# Patient Record
Sex: Male | Born: 1975 | Race: Black or African American | Hispanic: No | Marital: Single | Smoking: Never smoker
Health system: Southern US, Community
[De-identification: ages and names within clinical notes are randomized; demographics above are authoritative.]

---

## 2002-02-09 ENCOUNTER — Encounter: Payer: Self-pay | Admitting: Family Medicine

## 2002-02-09 ENCOUNTER — Ambulatory Visit (HOSPITAL_COMMUNITY): Admission: RE | Admit: 2002-02-09 | Discharge: 2002-02-09 | Payer: Self-pay | Admitting: Family Medicine

## 2014-10-31 ENCOUNTER — Emergency Department (HOSPITAL_COMMUNITY)
Admission: EM | Admit: 2014-10-31 | Discharge: 2014-10-31 | Disposition: A | Discharge status: Home / Self Care | Payer: PRIVATE HEALTH INSURANCE | Attending: Emergency Medicine | Admitting: Emergency Medicine

## 2014-10-31 ENCOUNTER — Encounter (HOSPITAL_COMMUNITY): Payer: Self-pay | Admitting: Emergency Medicine

## 2014-10-31 ENCOUNTER — Emergency Department (HOSPITAL_COMMUNITY): Payer: PRIVATE HEALTH INSURANCE

## 2014-10-31 DIAGNOSIS — X58XXXA Exposure to other specified factors, initial encounter: Secondary | ICD-10-CM | POA: Insufficient documentation

## 2014-10-31 DIAGNOSIS — S60551A Superficial foreign body of right hand, initial encounter: Secondary | ICD-10-CM | POA: Diagnosis present

## 2014-10-31 DIAGNOSIS — Y9389 Activity, other specified: Secondary | ICD-10-CM | POA: Insufficient documentation

## 2014-10-31 DIAGNOSIS — Y9289 Other specified places as the place of occurrence of the external cause: Secondary | ICD-10-CM | POA: Insufficient documentation

## 2014-10-31 DIAGNOSIS — Y998 Other external cause status: Secondary | ICD-10-CM | POA: Diagnosis not present

## 2014-10-31 LAB — CBC WITH DIFFERENTIAL/PLATELET
Basophils Absolute: 0 10*3/uL (ref 0.0–0.1)
Basophils Relative: 0 % (ref 0–1)
Eosinophils Absolute: 0.1 10*3/uL (ref 0.0–0.7)
Eosinophils Relative: 1 % (ref 0–5)
HCT: 42.2 % (ref 39.0–52.0)
Hemoglobin: 14.2 g/dL (ref 13.0–17.0)
Lymphocytes Relative: 23 % (ref 12–46)
Lymphs Abs: 1.1 10*3/uL (ref 0.7–4.0)
MCH: 26.2 pg (ref 26.0–34.0)
MCHC: 33.6 g/dL (ref 30.0–36.0)
MCV: 78 fL (ref 78.0–100.0)
Monocytes Absolute: 0.4 10*3/uL (ref 0.1–1.0)
Monocytes Relative: 9 % (ref 3–12)
Neutro Abs: 3 10*3/uL (ref 1.7–7.7)
Neutrophils Relative %: 67 % (ref 43–77)
Platelets: 270 10*3/uL (ref 150–400)
RBC: 5.41 MIL/uL (ref 4.22–5.81)
RDW: 14.4 % (ref 11.5–15.5)
WBC: 4.6 10*3/uL (ref 4.0–10.5)

## 2014-10-31 LAB — BASIC METABOLIC PANEL
Anion gap: 8 (ref 5–15)
BUN: 13 mg/dL (ref 6–23)
CO2: 28 mmol/L (ref 19–32)
Calcium: 9.3 mg/dL (ref 8.4–10.5)
Chloride: 103 mmol/L (ref 96–112)
Creatinine, Ser: 1.18 mg/dL (ref 0.50–1.35)
GFR calc Af Amer: 87 mL/min — ABNORMAL LOW (ref >90)
GFR calc non Af Amer: 75 mL/min — ABNORMAL LOW (ref >90)
Glucose, Bld: 96 mg/dL (ref 70–99)
Potassium: 4.4 mmol/L (ref 3.5–5.1)
Sodium: 139 mmol/L (ref 135–145)

## 2014-10-31 MED ORDER — LORAZEPAM 2 MG/ML IJ SOLN: 1.0000 mg | Freq: Once | INTRAMUSCULAR | Status: DC

## 2014-10-31 MED ORDER — SODIUM CHLORIDE 0.9 % IV BOLUS (SEPSIS)
500.0000 mL | Freq: Once | INTRAVENOUS | Status: AC
  Administered 2014-10-31: 500 mL via INTRAVENOUS

## 2014-10-31 MED ORDER — DOXYCYCLINE HYCLATE 100 MG PO CAPS: 100.0000 mg | ORAL_CAPSULE | Freq: Two times a day (BID) | ORAL | Status: DC

## 2014-10-31 MED ORDER — BACITRACIN ZINC 500 UNIT/GM EX OINT
TOPICAL_OINTMENT | CUTANEOUS | Status: DC
Start: 2014-10-31 — End: 2014-11-01
  Filled 2014-10-31: qty 0.9

## 2014-10-31 MED ORDER — LIDOCAINE HCL (PF) 2 % IJ SOLN
10.0000 mL | Freq: Once | INTRAMUSCULAR | Status: AC
Start: 2014-10-31 — End: 2014-10-31
  Administered 2014-10-31: 10 mL via INTRADERMAL
  Filled 2014-10-31: qty 10

## 2014-10-31 MED ORDER — IBUPROFEN 400 MG PO TABS
ORAL_TABLET | ORAL | Status: AC
  Administered 2014-10-31: 400 mg
  Filled 2014-10-31: qty 1

## 2014-10-31 MED ORDER — DOXYCYCLINE HYCLATE 100 MG PO TABS
100.0000 mg | ORAL_TABLET | Freq: Once | ORAL | Status: AC
  Administered 2014-10-31: 100 mg via ORAL
  Filled 2014-10-31: qty 1

## 2014-10-31 NOTE — ED Notes (Addendum)
Patient c/o right hand pain and swelling x6 weeks that is progressively getting worse. Patient denies any known hx. Patient has low grade temp of 100.2 in triage. CNS intact. Patient unable to make fist. Per patient could earlier but can't at this time.

## 2014-10-31 NOTE — Discharge Instructions (Signed)
Elevate your right hand above your heart, tonight.  Soak the right hand in warm water every 2 hours, for 3 days.  Take ibuprofen, 400 mg 3 times a day for pain.  See your doctor next week if you are not improving or have additional problems.  Return here, if needed.

## 2014-10-31 NOTE — ED Provider Notes (Signed)
CSN: [id number]     Arrival date & time 10/31/14  1559 History   First MD Initiated Contact with Patient 10/31/14 1932     Chief Complaint  Patient presents with  . Hand Pain     (Consider location/radiation/quality/duration/timing/severity/associated sxs/prior Treatment) HPI   Simmie DaviesKenneth Klett is a 39 y.o. male who presents for evaluation of pain, swelling right hand. The pain is gradually worse. No known trauma. He works as a Lobbyist"picker". He denies weakness, dizziness, nausea, vomiting, cough, shortness of breath or chest pain. There are no other known modifying factors.   History reviewed. No pertinent past medical history. History reviewed. No pertinent past surgical history. Family History  Problem Relation Age of Onset  . Cancer Mother    History  Substance Use Topics  . Smoking status: Never Smoker   . Smokeless tobacco: Never Used  . Alcohol Use: No    Review of Systems  All other systems reviewed and are negative.     Allergies  Review of patient's allergies indicates no known allergies.  Home Medications   Prior to Admission medications   Medication Sig Start Date End Date Taking? Authorizing Provider  ibuprofen (ADVIL,MOTRIN) 200 MG tablet Take 200 mg by mouth every 6 (six) hours as needed for mild pain or moderate pain.   Yes Historical Provider, MD  doxycycline (VIBRAMYCIN) 100 MG capsule Take 1 capsule (100 mg total) by mouth 2 (two) times daily. 10/31/14   Mancel BaleElliott Ruven Corradi, MD   BP 121/86 mmHg  Pulse 68  Temp(Src) 99.4 F (37.4 C) (Oral)  Resp 16  Ht 5\' 8"  (1.727 m)  Wt 230 lb (104.327 kg)  BMI 34.98 kg/m2  SpO2 95% Physical Exam  Constitutional: He is oriented to person, place, and time. He appears well-developed and well-nourished.  HENT:  Head: Normocephalic and atraumatic.  Right Ear: External ear normal.  Left Ear: External ear normal.  Eyes: Conjunctivae and EOM are normal. Pupils are equal, round, and reactive to light.  Neck: Normal range of  motion and phonation normal. Neck supple.  Cardiovascular: Normal rate.   Pulmonary/Chest: Effort normal. He exhibits no bony tenderness.  Musculoskeletal: Normal range of motion.  Also, right third knuckle with localized swelling, and an area of firmness consistent with subcutaneous abnormality of a nonspecific nature. There is no localized erythema, fluctuance or drainage. He is neurovascularly and functionally intact distally.  Neurological: He is alert and oriented to person, place, and time. No cranial nerve deficit or sensory deficit. He exhibits normal muscle tone. Coordination normal.  Skin: Skin is warm, dry and intact.  Psychiatric: He has a normal mood and affect. His behavior is normal. Judgment and thought content normal.  Nursing note and vitals reviewed.   ED Course  Procedures (including critical care time)  Patient was initially evaluated in a hallway bed. I walked with him into the patient. Room, to show him an image of his x-ray of the right hand. While we were discussing the findings, and the treatment, he told me that he felt funny. He then collapsed. I was able to catch him and got him to the floor, without trauma. He was then placed on a stretcher by nursing staff.  Medications  bacitracin 500 UNIT/GM ointment (not administered)  ibuprofen (ADVIL,MOTRIN) 400 MG tablet (not administered)  sodium chloride 0.9 % bolus 500 mL (500 mLs Intravenous New Bag/Given 10/31/14 2001)  lidocaine (XYLOCAINE) 2 % injection 10 mL (10 mLs Intradermal Given by Other 10/31/14 2001)  Patient Vitals for the past 24 hrs:  BP Temp Temp src Pulse Resp SpO2 Height Weight  10/31/14 1948 121/86 mmHg 99.4 F (37.4 C) Oral 68 16 95 % - -  10/31/14 1655 119/72 mmHg 100.2 F (37.9 C) Oral 80 20 100 %  (1.727 m) 230 lb (104.327 kg)   After stabilization with IV fluids, and I&D was carried out  Procedure- I&D right hand, to recover foreign body- right dorsal third MCP region cleansed with  Shur-Clens, then irrigated with saline. 2% Xylocaine was instilled for numbing. The area was opened over the indurated area, with a #11 blade scalpel, 0.5 cm. Wound was deeply probed. No foreign body was recovered. There is mild bleeding, but no other change. Post procedure. Patient was informed that he can still have a foreign body that needs to be removed either by warm soaks or if he has persistent symptoms. He may require orthopedic evaluation at that time   9:03 PM Reevaluation with update and discussion. After initial assessment and treatment, an updated evaluation reveals he was treated with ibuprofen for pain postprocedure. Findings discussed with the patient, all questions answered. Alishah Schulte L      Labs Review Labs Reviewed  BASIC METABOLIC PANEL - Abnormal; Notable for the following:    GFR calc non Af Amer 75 (*)    GFR calc Af Amer 87 (*)    All other components within normal limits  CBC WITH DIFFERENTIAL/PLATELET    Imaging Review Dg Hand Complete Right  10/31/2014   CLINICAL DATA:  Right hand pain and swelling over the past 6 weeks, worsening. Reduced range of motion.  EXAM: RIGHT HAND - COMPLETE 3+ VIEW  COMPARISON:  None.  FINDINGS: 10 mm curvilinear wirelike foreign body in the subcutaneous tissues dorsal to the third metacarpal, with surrounding soft tissue swelling.  No significant bony abnormality observed.  IMPRESSION: 1. 10 mm long wire shaped metal foreign body in the soft tissues dorsal to the head of the third metacarpal, with surrounding soft tissue swelling.   Electronically Signed   By: Gaylyn Rong M.D.   On: 10/31/2014 17:12     EKG Interpretation None      MDM   Final diagnoses:  Foreign body of right hand, initial encounter    Form body, right hand, status post I&D, without recovery of foreign body. Patient understands that he still has a foreign body and that it may require removal or further procedures to treat him. He'll be covered with  antibiotics, since he has a retained foreign body.  Nursing Notes Reviewed/ Care Coordinated Applicable Imaging Reviewed Interpretation of Laboratory Data incorporated into ED treatment  The patient appears reasonably screened and/or stabilized for discharge and I doubt any other medical condition or other [facility] requiring further screening, evaluation, or treatment in the ED at this time prior to discharge.  Plan: Home Medications- doxycycline; Home Treatments- warms soaks to encourage drainage, and recovery of foreign body.; return here if the recommended treatment, does not improve the symptoms; Recommended follow up- PCP, one week, possibly orthopedic referral that time for persistent symptoms     Mancel Bale, MD 10/31/14 2106

## 2015-04-01 ENCOUNTER — Encounter (HOSPITAL_COMMUNITY): Payer: Self-pay | Admitting: Emergency Medicine

## 2015-04-01 ENCOUNTER — Emergency Department (HOSPITAL_COMMUNITY)
Admission: EM | Admit: 2015-04-01 | Discharge: 2015-04-01 | Disposition: A | Discharge status: Home / Self Care | Payer: PRIVATE HEALTH INSURANCE | Attending: Emergency Medicine | Admitting: Emergency Medicine

## 2015-04-01 DIAGNOSIS — Z791 Long term (current) use of non-steroidal anti-inflammatories (NSAID): Secondary | ICD-10-CM | POA: Insufficient documentation

## 2015-04-01 DIAGNOSIS — M5441 Lumbago with sciatica, right side: Secondary | ICD-10-CM | POA: Insufficient documentation

## 2015-04-01 MED ORDER — CYCLOBENZAPRINE HCL 5 MG PO TABS: ORAL_TABLET | ORAL | Status: DC

## 2015-04-01 MED ORDER — DEXAMETHASONE SODIUM PHOSPHATE 4 MG/ML IJ SOLN
10.0000 mg | Freq: Once | INTRAMUSCULAR | Status: AC
  Administered 2015-04-01: 10 mg via INTRAMUSCULAR
  Filled 2015-04-01: qty 3

## 2015-04-01 MED ORDER — TRAMADOL HCL 50 MG PO TABS: 100.0000 mg | ORAL_TABLET | Freq: Four times a day (QID) | ORAL | Status: DC | PRN

## 2015-04-01 MED ORDER — DIAZEPAM 5 MG/ML IJ SOLN
10.0000 mg | Freq: Once | INTRAMUSCULAR | Status: AC
  Administered 2015-04-01: 10 mg via INTRAMUSCULAR
  Filled 2015-04-01: qty 2

## 2015-04-01 MED ORDER — NAPROXEN 500 MG PO TABS: 500.0000 mg | ORAL_TABLET | Freq: Two times a day (BID) | ORAL | Status: DC

## 2015-04-01 MED ORDER — KETOROLAC TROMETHAMINE 60 MG/2ML IM SOLN
60.0000 mg | Freq: Once | INTRAMUSCULAR | Status: AC
  Administered 2015-04-01: 60 mg via INTRAMUSCULAR
  Filled 2015-04-01: qty 2

## 2015-04-01 NOTE — Discharge Instructions (Signed)
Try ice and heat on your back. Take the medications as prescribed. Recheck if you aren't improving in the next week or if you get worse. Do the back exercises once your pain is better to strengthen your back.    Sciatica Sciatica is pain, weakness, numbness, or tingling along your sciatic nerve. The nerve starts in the lower back and runs down the back of each leg. Nerve damage or certain conditions pinch or put pressure on the sciatic nerve. This causes the pain, weakness, and other discomforts of sciatica. HOME CARE   Only take medicine as told by your doctor.  Apply ice to the affected area for 20 minutes. Do this 3-4 times a day for the first 48-72 hours. Then try heat in the same way.  Exercise, stretch, or do your usual activities if these do not make your pain worse.  Go to physical therapy as told by your doctor.  Keep all doctor visits as told.  Do not wear high heels or shoes that are not supportive.  Get a firm mattress if your mattress is too soft to lessen pain and discomfort. GET HELP RIGHT AWAY IF:   You cannot control when you poop (bowel movement) or pee (urinate).  You have more weakness in your lower back, lower belly (pelvis), butt (buttocks), or legs.  You have redness or puffiness (swelling) of your back.  You have a burning feeling when you pee.  You have pain that gets worse when you lie down.  You have pain that wakes you from your sleep.  Your pain is worse than past pain.  Your pain lasts longer than 4 weeks.  You are suddenly losing weight without reason. MAKE SURE YOU:   Understand these instructions.  Will watch this condition.  Will get help right away if you are not doing well or get worse. Document Released: 05/18/2008 Document Revised: 02/08/2012 Document Reviewed: 12/19/2011 [facility] Patient Information 2015 [place], [place]. This information is not intended to replace advice given to you by your health care provider. Make sure you  discuss any questions you have with your health care provider.  Back Exercises Back exercises help treat and prevent back injuries. The goal is to increase your strength in your belly (abdominal) and back muscles. These exercises can also help with flexibility. Start these exercises when told by your doctor. HOME CARE Back exercises include: Pelvic Tilt.  Lie on your back with your knees bent. Tilt your pelvis until the lower part of your back is against the floor. Hold this position 5 to 10 sec. Repeat this exercise 5 to 10 times. Knee to Chest.  Pull 1 knee up against your chest and hold for 20 to 30 seconds. Repeat this with the other knee. This may be done with the other leg straight or bent, whichever feels better. Then, pull both knees up against your chest. Sit-Ups or Curl-Ups.  Bend your knees 90 degrees. Start with tilting your pelvis, and do a partial, slow sit-up. Only lift your upper half 30 to 45 degrees off the floor. Take at least 2 to 3 seonds for each sit-up. Do not do sit-ups with your knees out straight. If partial sit-ups are difficult, simply do the above but with only tightening your belly (abdominal) muscles and holding it as told. Hip-Lift.  Lie on your back with your knees flexed 90 degrees. Push down with your feet and shoulders as you raise your hips 2 inches off the floor. Hold for 10 seconds,  repeat 5 to 10 times. Back Arches.  Lie on your stomach. Prop yourself up on bent elbows. Slowly press on your hands, causing an arch in your low back. Repeat 3 to 5 times. Shoulder-Lifts.  Lie face down with arms beside your body. Keep hips and belly pressed to floor as you slowly lift your head and shoulders off the floor. Do not overdo your exercises. Be careful in the beginning. Exercises may cause you some mild back discomfort. If the pain lasts for more than 15 minutes, stop the exercises until you see your doctor. Improvement with exercise for back problems is slow.    Document Released: 09/11/2010 Document Revised: 11/01/2011 Document Reviewed: 06/10/2011 [facility] Patient Information 2015 [place], [place]. This information is not intended to replace advice given to you by your health care provider. Make sure you discuss any questions you have with your health care provider.

## 2015-04-01 NOTE — ED Provider Notes (Signed)
CSN: [id number]     Arrival date & time 04/01/15  0506 History   First MD Initiated Contact with Patient 04/01/15 [phone]   Chief Complaint  Patient presents with  . 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 He states the following day he had some pain in his left calf. He states on August 5 he woke up at 7 AM and felt fine. He did not get out of bed and when he awakened again at 9 AM he was unable to get out of bed due to pain in his right back. He states the pain radiates into his right lower extremity. The pain is constant and sharp. He states walking and bending over makes the pain worse. He states elevating his right leg makes it feel better. He states he's never had this pain before. He states he normally lifts 5-60 pounds at work. No urinary or rectal incontinence.    PCP PA B Mann  History reviewed. No pertinent past medical history. History reviewed. No pertinent past surgical history. Family History  Problem Relation Age of Onset  . Cancer Mother    History  Substance Use Topics  . Smoking status: Never Smoker   . Smokeless tobacco: Never Used  . Alcohol Use: No  employed  Review of Systems  All other systems reviewed and are negative.   Allergies  Review of patient's allergies indicates no known allergies.  Home Medications   Prior to Admission medications   Medication Sig Start Date End Date Taking? Authorizing Provider  ibuprofen (ADVIL,MOTRIN) 200 MG tablet Take 200 mg by mouth every 6 (six) hours as needed for mild pain or moderate pain.   Yes Historical Provider, MD  cyclobenzaprine (FLEXERIL) 5 MG tablet Take 1 or 2 po Q 6hrs for pain (can make you sleepy) 04/01/15   Devoria Albe, MD  doxycycline (VIBRAMYCIN) 100 MG capsule Take 1 capsule (100 mg total) by mouth 2 (two)  times daily. 10/31/14   Mancel Bale, MD  naproxen (NAPROSYN) 500 MG tablet Take 1 tablet (500 mg total) by mouth 2 (two) times daily. 04/01/15   Devoria Albe, MD  traMADol (ULTRAM) 50 MG tablet Take 2 tablets (100 mg total) by mouth every 6 (six) hours as needed. 04/01/15   Devoria Albe, MD   BP 148/67 mmHg  Pulse 60  Temp(Src) 98.2 F (36.8 C) (Oral)  Resp 18  Ht 5\' 8"  (1.727 m)  Wt 236 lb (107.049 kg)  BMI 35.89 kg/m2  SpO2 99%  Vital signs normal   Physical Exam  Constitutional: He is oriented to person, place, and time. He appears well-developed and well-nourished.  Non-toxic appearance. He does not appear ill. No distress.  HENT:  Head: Normocephalic and atraumatic.  Right Ear: External ear normal.  Left Ear: External ear normal.  Nose: Nose normal. No mucosal edema or rhinorrhea.  Mouth/Throat: Mucous membranes are normal. No dental abscesses or uvula swelling.  Eyes: Conjunctivae and EOM are normal.  Neck: Normal range of motion and full passive range of motion without pain.  Pulmonary/Chest: Effort normal. No respiratory distress. He has no rhonchi. He exhibits no crepitus.  Abdominal: Normal appearance.  Musculoskeletal: He exhibits no edema or tenderness.  Seems uncomfortable when he tries to stand up. Patient's cervical, thoracic, lumbar spine are nontender to palpation. He does have tenderness in  the right sciatic notch that reproduces his complaints of pain. He has pain on range of motion of the waist when he leans forward into the right. He has pain on straight leg raising on the right. His patellar reflexes are 2+ and equal bilaterally.  Neurological: He is alert and oriented to person, place, and time. He has normal strength. No cranial nerve deficit.  Skin: Skin is warm, dry and intact. No rash noted. No erythema. No pallor.  Psychiatric: He has a normal mood and affect. His speech is normal and behavior is normal. His mood appears not anxious.  Nursing note and vitals  reviewed.   ED Course  Procedures (including critical care time)  Medications  dexamethasone (DECADRON) injection 10 mg (10 mg Intramuscular Given 04/01/15 0707)  ketorolac (TORADOL) injection 60 mg (60 mg Intramuscular Given 04/01/15 0708)  diazepam (VALIUM) injection 10 mg (10 mg Intramuscular Given 04/01/15 0708)   Pt was given medication for his sciatica back pain.    Labs Review Labs Reviewed - No data to display  Imaging Review No results found.   EKG Interpretation None      MDM   Final diagnoses:  Right-sided low back pain with right-sided sciatica   New Prescriptions   CYCLOBENZAPRINE (FLEXERIL) 5 MG TABLET    Take 1 or 2 po Q 6hrs for pain (can make you sleepy)   NAPROXEN (NAPROSYN) 500 MG TABLET    Take 1 tablet (500 mg total) by mouth 2 (two) times daily.   TRAMADOL (ULTRAM) 50 MG TABLET    Take 2 tablets (100 mg total) by mouth every 6 (six) hours as needed.    Plan discharge  Devoria Albe, MD, Concha Pyo, MD 04/01/15 [phone]

## 2015-04-01 NOTE — ED Notes (Signed)
Pt c/o lower back pain that radiates down rt leg.

## 2016-08-04 ENCOUNTER — Encounter (HOSPITAL_COMMUNITY): Payer: Self-pay | Admitting: *Deleted

## 2016-08-04 ENCOUNTER — Emergency Department (HOSPITAL_COMMUNITY)
Admission: EM | Admit: 2016-08-04 | Discharge: 2016-08-04 | Disposition: A | Discharge status: Home / Self Care | Payer: 59 | Attending: Emergency Medicine | Admitting: Emergency Medicine

## 2016-08-04 DIAGNOSIS — M545 Low back pain, unspecified: Secondary | ICD-10-CM

## 2016-08-04 LAB — URINALYSIS, ROUTINE W REFLEX MICROSCOPIC
Bacteria, UA: NONE SEEN
Bilirubin Urine: NEGATIVE
Glucose, UA: NEGATIVE mg/dL
Hgb urine dipstick: NEGATIVE
Ketones, ur: NEGATIVE mg/dL
Leukocytes, UA: NEGATIVE
Nitrite: NEGATIVE
PROTEIN: NEGATIVE mg/dL
Specific Gravity, Urine: 1.018 (ref 1.005–1.030)
pH: 6 (ref 5.0–8.0)

## 2016-08-04 MED ORDER — CYCLOBENZAPRINE HCL 5 MG PO TABS: 5.0000 mg | ORAL_TABLET | Freq: Two times a day (BID) | ORAL | 0 refills | Status: AC | PRN

## 2016-08-04 MED ORDER — NAPROXEN 500 MG PO TABS: 500.0000 mg | ORAL_TABLET | Freq: Two times a day (BID) | ORAL | 0 refills | Status: AC

## 2016-08-04 MED ORDER — CYCLOBENZAPRINE HCL 10 MG PO TABS
5.0000 mg | ORAL_TABLET | Freq: Once | ORAL | Status: AC
  Administered 2016-08-04: 5 mg via ORAL

## 2016-08-04 MED ORDER — KETOROLAC TROMETHAMINE 30 MG/ML IJ SOLN
30.0000 mg | Freq: Once | INTRAMUSCULAR | Status: AC
  Administered 2016-08-04: 30 mg via INTRAMUSCULAR

## 2016-08-04 MED ORDER — KETOROLAC TROMETHAMINE 30 MG/ML IJ SOLN
INTRAMUSCULAR | Status: AC
  Filled 2016-08-04: qty 1

## 2016-08-04 MED ORDER — CYCLOBENZAPRINE HCL 10 MG PO TABS
ORAL_TABLET | ORAL | Status: AC
  Filled 2016-08-04: qty 1

## 2016-08-04 NOTE — ED Provider Notes (Signed)
[facility]-EMERGENCY DEPT Provider Note   CSN: [id number] Arrival date & time: 08/04/16  0144     History   Chief Complaint Chief Complaint  Patient presents with  . Flank Pain    HPI Ronnie Ramos is a 40 y.o. male.  HPI  This is a 40 year old male with no significant past medical history who presents with left back pain. Patient reports onset of symptoms one week ago. He states the pain is in his lower left back. It is sharp at times and constant. It is worse with certain movements and lying flat. Currently his pain is 8 out of 10. He reports that he is tried ibuprofen at home with some relief. He also reports relief with heating pad. Denies any weakness, numbness, tingling of the legs. Denies any bowel or bladder difficulties. Denies any hematuria or dysuria. He does do heavy lifting at work but denies any obvious injury.  History reviewed. No pertinent past medical history.  There are no active problems to display for this patient.   History reviewed. No pertinent surgical history.     Home Medications    Prior to Admission medications   Medication Sig Start Date End Date Taking? Authorizing Provider  cyclobenzaprine (FLEXERIL) 5 MG tablet Take 1 tablet (5 mg total) by mouth 2 (two) times daily as needed for muscle spasms. 08/04/16   Shon Batonourtney F Gerritt Galentine, MD  naproxen (NAPROSYN) 500 MG tablet Take 1 tablet (500 mg total) by mouth 2 (two) times daily. 08/04/16   Shon Batonourtney F Samel Bruna, MD    Family History Family History  Problem Relation Age of Onset  . Cancer Mother     Social History Social History  Substance Use Topics  . Smoking status: Never Smoker  . Smokeless tobacco: Never Used  . Alcohol use No     Allergies   Patient has no known allergies.   Review of Systems Review of Systems  Constitutional: Negative for fever.  Respiratory: Negative for shortness of breath.   Cardiovascular: Negative for chest pain.  Gastrointestinal: Negative for abdominal pain,  nausea and vomiting.  Genitourinary: Negative for dysuria and hematuria.  Musculoskeletal: Positive for back pain.  Skin: Negative for wound.  Neurological: Negative for weakness and numbness.  All other systems reviewed and are negative.    Physical Exam Updated Vital Signs BP 138/78 (BP Location: Right Arm)   Pulse (!) 53   Temp 97.8 F (36.6 C) (Oral)   Resp 18   Ht 5\' 9"  (1.753 m)   Wt 240 lb (108.9 kg)   SpO2 100%   BMI 35.44 kg/m   Physical Exam  Constitutional: He is oriented to person, place, and time. He appears well-developed and well-nourished. No distress.  HENT:  Head: Normocephalic and atraumatic.  Cardiovascular: Normal rate, regular rhythm and normal heart sounds.   No murmur heard. Pulmonary/Chest: Effort normal and breath sounds normal. No respiratory distress. He has no wheezes.  Abdominal: Soft. There is no tenderness. There is no rebound.  Musculoskeletal: He exhibits no edema.  Tenderness palpation left lower lumbar paraspinous muscle region, no midline tenderness to palpation, step-off, or deformity  Lymphadenopathy:    He has no cervical adenopathy.  Neurological: He is alert and oriented to person, place, and time.  Normal gait, 5 out of 5 strength bilateral lower extremities, normal reflexes  Skin: Skin is warm and dry.  Psychiatric: He has a normal mood and affect.  Nursing note and vitals reviewed.    ED Treatments / Results  Labs (all labs ordered are listed, but only abnormal results are displayed) Labs Reviewed  URINALYSIS, ROUTINE W REFLEX MICROSCOPIC    EKG  EKG Interpretation None       Radiology No results found.  Procedures Procedures (including critical care time)  Medications Ordered in ED Medications  ketorolac (TORADOL) 30 MG/ML injection 30 mg (30 mg Intramuscular Given 08/04/16 0219)  cyclobenzaprine (FLEXERIL) tablet 5 mg (5 mg Oral Given 08/04/16 0219)     Initial Impression / Assessment and Plan / ED  Course  I have reviewed the triage vital signs and the nursing notes.  Pertinent labs & imaging results that were available during my care of the patient were reviewed by me and considered in my medical decision making (see chart for details).  Clinical Course     Patient presents with left-sided back pain. Reproducible on exam. Worse with movement. Suggested musculoskeletal etiology. No evidence of sciatica. No other red flags. Urine is clean. Patient given Toradol and Flexeril. Symptom control at home.  After history, exam, and medical workup I feel the patient has been appropriately medically screened and is safe for discharge home. Pertinent diagnoses were discussed with the patient. Patient was given return precautions.   Final Clinical Impressions(s) / ED Diagnoses   Final diagnoses:  Acute left-sided low back pain without sciatica    New Prescriptions New Prescriptions   CYCLOBENZAPRINE (FLEXERIL) 5 MG TABLET    Take 1 tablet (5 mg total) by mouth 2 (two) times daily as needed for muscle spasms.   NAPROXEN (NAPROSYN) 500 MG TABLET    Take 1 tablet (500 mg total) by mouth 2 (two) times daily.     Shon Batonourtney F Keylin Ferryman, MD 08/04/16 [phone]

## 2016-08-04 NOTE — ED Triage Notes (Signed)
Pt c/o left flank pain x 1 week; pt denies any urinary sx

## 2016-08-04 NOTE — Discharge Instructions (Signed)
You were seen today for back pain. This is likely musculoskeletal nature. Avoid heavy lifting. Heat or ice as tolerated. Naproxen and Flexeril for pain. If you develop any new or worsening symptoms including weakness, numbness, tingling of the lower extremities, you should be reevaluated.

## 2016-08-30 IMAGING — DX DG HAND COMPLETE 3+V*R*
3 series · 3 of 3 positions shown · non-contrast
Comparison: None.

CLINICAL DATA: Right hand pain and swelling over the past 6 weeks,
worsening. Reduced range of motion.

EXAM:
RIGHT HAND - COMPLETE 3+ VIEW

[hand pa]
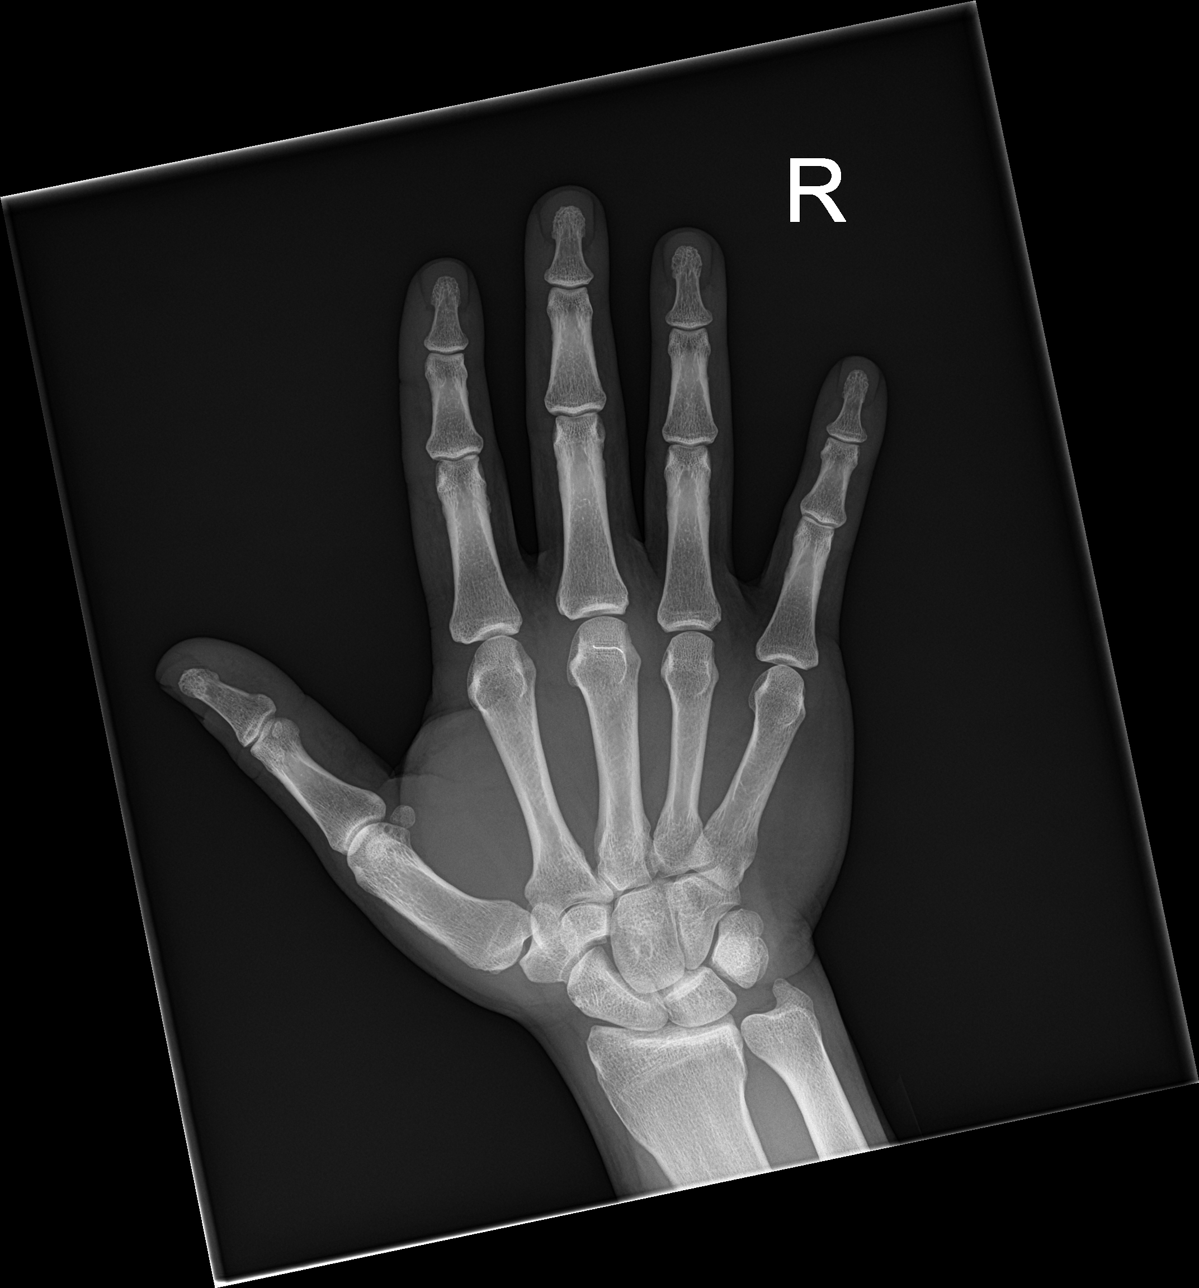

[hand obl]
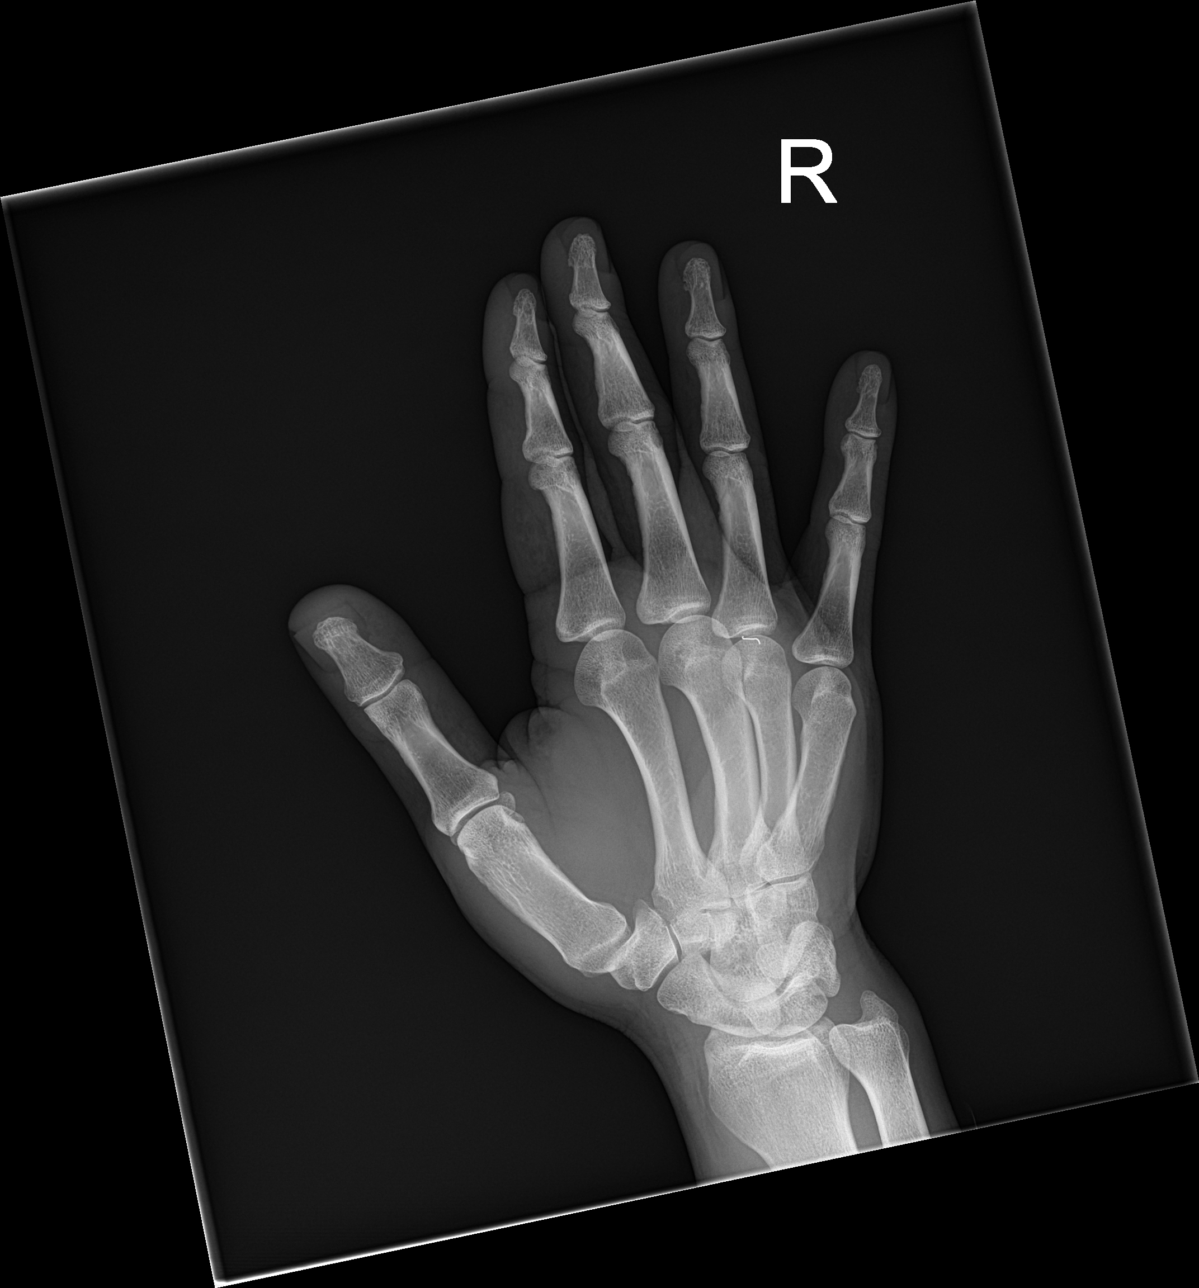

[hand lat]
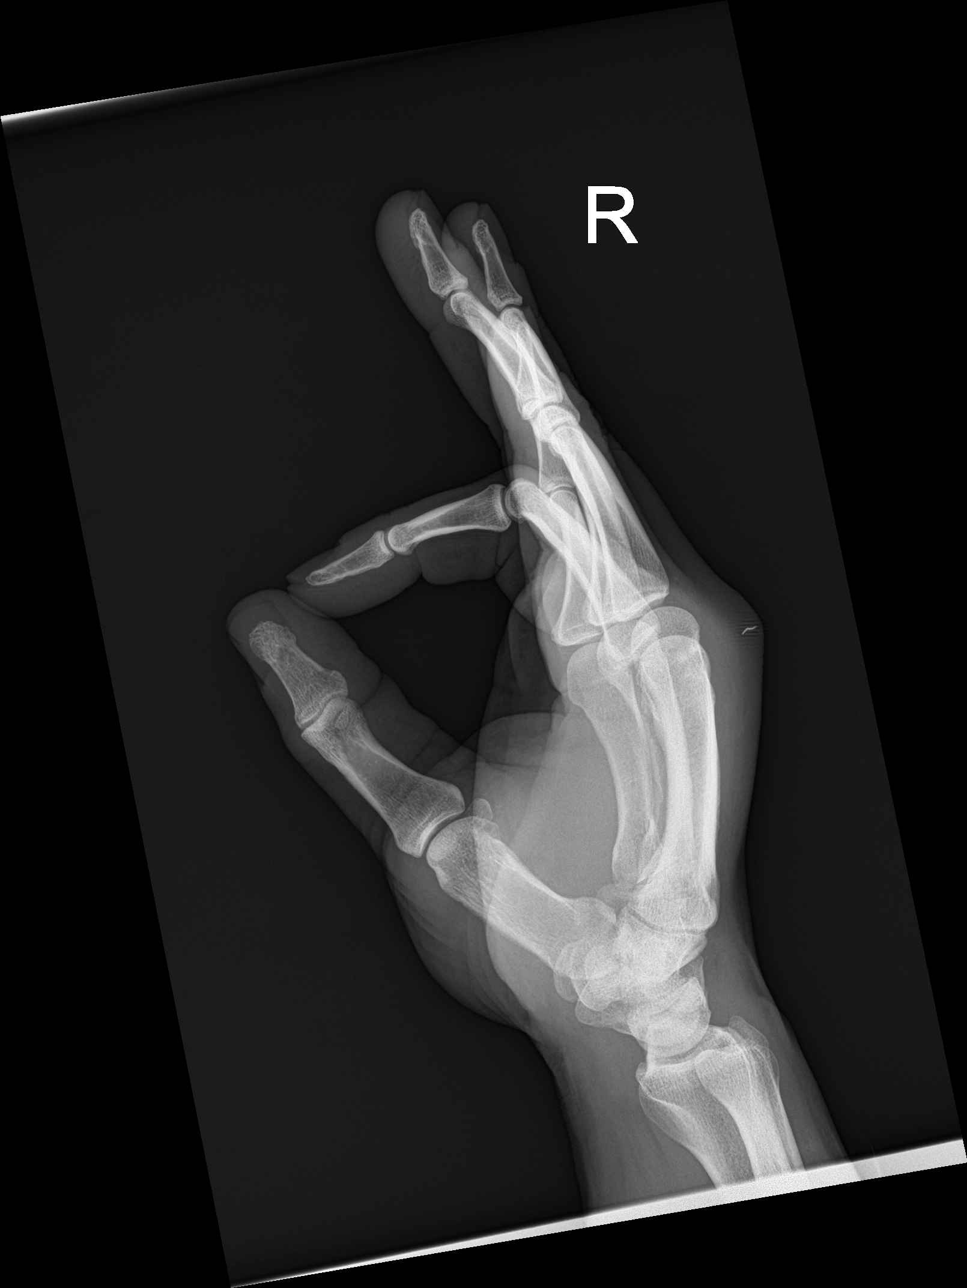

[3 of 3 positions shown; findings below may reference images not displayed]

FINDINGS: 10 mm curvilinear wirelike foreign body in the subcutaneous tissues
dorsal to the third metacarpal, with surrounding soft tissue
swelling.

No significant bony abnormality observed.
IMPRESSION: 1. 10 mm long wire shaped metal foreign body in the soft tissues
dorsal to the head of the third metacarpal, with surrounding soft
tissue swelling.

## 2017-06-02 DIAGNOSIS — R7309 Other abnormal glucose: Secondary | ICD-10-CM | POA: Diagnosis not present

## 2017-06-02 DIAGNOSIS — M25561 Pain in right knee: Secondary | ICD-10-CM | POA: Diagnosis not present

## 2017-06-02 DIAGNOSIS — Z0000 Encounter for general adult medical examination without abnormal findings: Secondary | ICD-10-CM | POA: Diagnosis not present

## 2017-06-02 DIAGNOSIS — Z1389 Encounter for screening for other disorder: Secondary | ICD-10-CM | POA: Diagnosis not present

## 2017-06-02 DIAGNOSIS — E291 Testicular hypofunction: Secondary | ICD-10-CM | POA: Diagnosis not present

## 2018-07-11 DIAGNOSIS — Z0000 Encounter for general adult medical examination without abnormal findings: Secondary | ICD-10-CM | POA: Diagnosis not present

## 2018-07-11 DIAGNOSIS — Z713 Dietary counseling and surveillance: Secondary | ICD-10-CM | POA: Diagnosis not present

## 2018-07-11 DIAGNOSIS — E6609 Other obesity due to excess calories: Secondary | ICD-10-CM | POA: Diagnosis not present

## 2018-08-28 DIAGNOSIS — E6609 Other obesity due to excess calories: Secondary | ICD-10-CM | POA: Diagnosis not present

## 2018-12-18 DIAGNOSIS — Z6835 Body mass index (BMI) 35.0-35.9, adult: Secondary | ICD-10-CM | POA: Diagnosis not present

## 2018-12-18 DIAGNOSIS — R635 Abnormal weight gain: Secondary | ICD-10-CM | POA: Diagnosis not present

## 2018-12-18 DIAGNOSIS — E6609 Other obesity due to excess calories: Secondary | ICD-10-CM | POA: Diagnosis not present
